# Patient Record
Sex: Male | Born: 1975
Health system: Southern US, Community
[De-identification: ages and names within clinical notes are randomized; demographics above are authoritative.]

---

## 2002-04-22 ENCOUNTER — Inpatient Hospital Stay (HOSPITAL_COMMUNITY): Admission: AD | Admit: 2002-04-22 | Discharge: 2002-05-03 | Payer: Self-pay | Admitting: Internal Medicine

## 2002-04-22 ENCOUNTER — Encounter: Payer: Self-pay | Admitting: Internal Medicine

## 2002-04-25 ENCOUNTER — Encounter: Payer: Self-pay | Admitting: Internal Medicine

## 2002-04-27 ENCOUNTER — Encounter: Payer: Self-pay | Admitting: Internal Medicine

## 2002-04-29 ENCOUNTER — Encounter: Payer: Self-pay | Admitting: Internal Medicine

## 2002-05-01 ENCOUNTER — Encounter: Payer: Self-pay | Admitting: Internal Medicine

## 2002-05-02 ENCOUNTER — Encounter: Payer: Self-pay | Admitting: Internal Medicine

## 2013-04-03 ENCOUNTER — Emergency Department (HOSPITAL_COMMUNITY): Payer: BC Managed Care – PPO

## 2013-04-03 ENCOUNTER — Encounter (HOSPITAL_COMMUNITY)
Admission: EM | Disposition: A | Discharge status: Other Acute Inpatient Hospital | Payer: Self-pay | Source: Home / Self Care | Attending: Emergency Medicine

## 2013-04-03 ENCOUNTER — Emergency Department (HOSPITAL_COMMUNITY)
Admission: EM | Admit: 2013-04-03 | Discharge: 2013-04-03 | Disposition: A | Discharge status: Other Acute Inpatient Hospital | Payer: BC Managed Care – PPO | Attending: Emergency Medicine | Admitting: Emergency Medicine

## 2013-04-03 DIAGNOSIS — S0990XA Unspecified injury of head, initial encounter: Secondary | ICD-10-CM | POA: Diagnosis not present

## 2013-04-03 DIAGNOSIS — Y9301 Activity, walking, marching and hiking: Secondary | ICD-10-CM | POA: Insufficient documentation

## 2013-04-03 DIAGNOSIS — W1809XA Striking against other object with subsequent fall, initial encounter: Secondary | ICD-10-CM | POA: Insufficient documentation

## 2013-04-03 DIAGNOSIS — S0531XA Ocular laceration without prolapse or loss of intraocular tissue, right eye, initial encounter: Secondary | ICD-10-CM

## 2013-04-03 DIAGNOSIS — S0590XA Unspecified injury of unspecified eye and orbit, initial encounter: Secondary | ICD-10-CM | POA: Diagnosis present

## 2013-04-03 DIAGNOSIS — F10229 Alcohol dependence with intoxication, unspecified: Secondary | ICD-10-CM | POA: Diagnosis not present

## 2013-04-03 DIAGNOSIS — S0520XA Ocular laceration and rupture with prolapse or loss of intraocular tissue, unspecified eye, initial encounter: Secondary | ICD-10-CM | POA: Insufficient documentation

## 2013-04-03 DIAGNOSIS — Y9229 Other specified public building as the place of occurrence of the external cause: Secondary | ICD-10-CM | POA: Insufficient documentation

## 2013-04-03 LAB — CBC WITH DIFFERENTIAL/PLATELET
Basophils Absolute: 0.1 10*3/uL (ref 0.0–0.1)
HCT: 41 % (ref 39.0–52.0)
Hemoglobin: 14.5 g/dL (ref 13.0–17.0)
Lymphocytes Relative: 33 % (ref 12–46)
Lymphs Abs: 2.7 10*3/uL (ref 0.7–4.0)
Monocytes Absolute: 0.7 10*3/uL (ref 0.1–1.0)
Neutro Abs: 4.9 10*3/uL (ref 1.7–7.7)
RBC: 4.85 MIL/uL (ref 4.22–5.81)
RDW: 13.1 % (ref 11.5–15.5)
WBC: 8.4 10*3/uL (ref 4.0–10.5)

## 2013-04-03 LAB — BASIC METABOLIC PANEL
BUN: 13 mg/dL (ref 6–23)
CO2: 24 mEq/L (ref 19–32)
Chloride: 100 mEq/L (ref 96–112)
GFR calc Af Amer: >90 mL/min (ref >90)
Potassium: 3.7 mEq/L (ref 3.5–5.1)

## 2013-04-03 SURGERY — CANCELLED PROCEDURE

## 2013-04-03 MED ORDER — DEXTROSE 5 % IV SOLN
1.0000 g | Freq: Once | INTRAVENOUS | Status: AC
  Administered 2013-04-03: 1 g via INTRAVENOUS
  Filled 2013-04-03: qty 1

## 2013-04-03 MED ORDER — LACTATED RINGERS IV SOLN
INTRAVENOUS | Status: DC
  Administered 2013-04-03: 02:00:00 via INTRAVENOUS

## 2013-04-03 MED ORDER — VANCOMYCIN HCL IN DEXTROSE 1-5 GM/200ML-% IV SOLN
1000.0000 mg | Freq: Once | INTRAVENOUS | Status: DC
  Filled 2013-04-03: qty 200

## 2013-04-03 MED ORDER — CEFTAZIDIME 1 G IJ SOLR: 1.0000 g | Freq: Once | INTRAMUSCULAR | Status: DC

## 2013-04-03 SURGICAL SUPPLY — 30 items
APL SRG 3 HI ABS STRL LF PLS (MISCELLANEOUS) ×1
APPLICATOR DR MATTHEWS STRL (MISCELLANEOUS) ×3 IMPLANT
CANNULA ANT CHAM MAIN (OPHTHALMIC RELATED) IMPLANT
CLOTH BEACON ORANGE TIMEOUT ST (SAFETY) ×3 IMPLANT
CORDS BIPOLAR (ELECTRODE) ×3 IMPLANT
ERASER HMR WETFIELD 23G BP (MISCELLANEOUS) ×3 IMPLANT
GLOVE SURG SIGNA 7.5 PF LTX (GLOVE) ×3 IMPLANT
GOWN STRL NON-REIN LRG LVL3 (GOWN DISPOSABLE) ×6 IMPLANT
KIT BASIN OR (CUSTOM PROCEDURE TRAY) ×3 IMPLANT
KIT ROOM TURNOVER OR (KITS) ×3 IMPLANT
MARKER SKIN DUAL TIP RULER LAB (MISCELLANEOUS) ×3 IMPLANT
NS IRRIG 1000ML POUR BTL (IV SOLUTION) ×3 IMPLANT
PACK CATARACT CUSTOM (CUSTOM PROCEDURE TRAY) ×3 IMPLANT
PAD ARMBOARD 7.5X6 YLW CONV (MISCELLANEOUS) ×6 IMPLANT
PAK VITRECTOMY PIK  23GA (OPHTHALMIC RELATED) IMPLANT
SPEAR EYE SURG WECK-CEL (MISCELLANEOUS) IMPLANT
STRIP CLOSURE SKIN 1/2X4 (GAUZE/BANDAGES/DRESSINGS) ×3 IMPLANT
SUT ETHILON 10 0 CS140 6 (SUTURE) ×3 IMPLANT
SUT ETHILON 8 0 TG100 8 (SUTURE) IMPLANT
SUT ETHILON 9 0 TG140 8 (SUTURE) IMPLANT
SUT MERSILENE 4 0 S 24 (SUTURE) IMPLANT
SUT SILK 4 0 RB 1 (SUTURE) IMPLANT
SUT VICRYL 7 0 TG140 8 (SUTURE) IMPLANT
SUT VICRYL 8 0 TG140 8 (SUTURE) IMPLANT
SUT VICRYL 9-0 (SUTURE) IMPLANT
SYR TB 1ML LUER SLIP (SYRINGE) ×3 IMPLANT
TOWEL OR 17X24 6PK STRL BLUE (TOWEL DISPOSABLE) ×6 IMPLANT
VITCUTTER PED ULTRA 25G SHORT (OPHTHALMIC) IMPLANT
WATER STERILE IRR 1000ML POUR (IV SOLUTION) ×3 IMPLANT
WIPE INSTRUMENT VISIWIPE 73X73 (MISCELLANEOUS) IMPLANT

## 2013-04-03 NOTE — ED Notes (Signed)
Trauma End: Pt transferred to [facility].
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
## 2013-04-03 NOTE — ED Notes (Signed)
Spoke with his wife Tresa Endo and she said he had a corneal transplant in his right eye 12 years ago.  She is out of town so will try to get in contact with his brother or father.
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
## 2013-04-15 ENCOUNTER — Ambulatory Visit
Admission: RE | Admit: 2013-04-15 | Discharge: 2013-04-15 | Disposition: A | Discharge status: Home / Self Care | Payer: BC Managed Care – PPO | Source: Ambulatory Visit | Attending: Internal Medicine | Admitting: Internal Medicine

## 2013-04-15 ENCOUNTER — Other Ambulatory Visit: Payer: Self-pay | Admitting: Internal Medicine

## 2013-04-15 DIAGNOSIS — S0591XA Unspecified injury of right eye and orbit, initial encounter: Secondary | ICD-10-CM

## 2014-09-21 ENCOUNTER — Encounter (HOSPITAL_COMMUNITY): Payer: Self-pay | Admitting: Ophthalmology
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
## 2019-10-11 ENCOUNTER — Other Ambulatory Visit: Payer: Self-pay

## 2019-10-11 ENCOUNTER — Encounter (INDEPENDENT_AMBULATORY_CARE_PROVIDER_SITE_OTHER): Payer: Self-pay | Admitting: Otolaryngology

## 2019-10-11 ENCOUNTER — Ambulatory Visit (INDEPENDENT_AMBULATORY_CARE_PROVIDER_SITE_OTHER): Payer: No Typology Code available for payment source | Admitting: Otolaryngology

## 2019-10-11 VITALS — Temp 97.2°F

## 2019-10-11 DIAGNOSIS — H9312 Tinnitus, left ear: Secondary | ICD-10-CM | POA: Diagnosis not present

## 2019-10-11 DIAGNOSIS — H9042 Sensorineural hearing loss, unilateral, left ear, with unrestricted hearing on the contralateral side: Secondary | ICD-10-CM | POA: Diagnosis not present

## 2019-10-11 MED ORDER — PREDNISONE 10 MG PO TABS: 10.0000 mg | ORAL_TABLET | Freq: Every day | ORAL | 0 refills | Status: AC

## 2019-10-11 NOTE — Progress Notes (Signed)
HPI: Dustin Waters is a 44 y.o. male who presents for evaluation of left ear tinnitus and hearing loss.  He states that his right ear has always been his better hearing ear.  But 1 week ago he woke up with ringing and diminished hearing in the left ear.  Denies any loud noise exposure or trauma to the ear.  Denies any upper respiratory infection.  He had a previous hearing test over 10 years ago but does not know the results but felt like he had slightly better hearing in the right ear at that time but nothing else was done.  However the tinnitus has been fairly constant over the past week.  No past medical history on file.  Social History   Socioeconomic History  . Marital status: Married    Spouse name: Not on file  . Number of children: Not on file  . Years of education: Not on file  . Highest education level: Not on file  Occupational History  . Not on file  Tobacco Use  . Smoking status: Never Smoker  . Smokeless tobacco: Never Used  Substance and Sexual Activity  . Alcohol use: Not on file  . Drug use: Not on file  . Sexual activity: Not on file  Other Topics Concern  . Not on file  Social History Narrative  . Not on file   Social Determinants of Health   Financial Resource Strain:   . Difficulty of Paying Living Expenses: Not on file  Food Insecurity:   . Worried About Programme researcher, broadcasting/film/video in the Last Year: Not on file  . Ran Out of Food in the Last Year: Not on file  Transportation Needs:   . Lack of Transportation (Medical): Not on file  . Lack of Transportation (Non-Medical): Not on file  Physical Activity:   . Days of Exercise per Week: Not on file  . Minutes of Exercise per Session: Not on file  Stress:   . Feeling of Stress : Not on file  Social Connections:   . Frequency of Communication with Friends and Family: Not on file  . Frequency of Social Gatherings with Friends and Family: Not on file  . Attends Religious Services: Not on file  . Active Member  of Clubs or Organizations: Not on file  . Attends Banker Meetings: Not on file  . Marital Status: Not on file   No family history on file. Not on File Prior to Admission medications   Not on File     Positive ROS: No dizziness or vertigo  All other systems have been reviewed and were otherwise negative with the exception of those mentioned in the HPI and as above.  Physical Exam: Constitutional: Alert, well-appearing, no acute distress Ears: External ears without lesions or tenderness. Ear canals are clear bilaterally with intact, clear TMs.  Bilaterally with good mobility on pneumatic otoscopy.  On hearing screening with a 512 1024 tuning fork he heard better in the right ear compared to the left but only a mild to moderate hearing loss noted in the left side.  AC > BC bilaterally. Nasal: External nose without lesions. Septum severely deviated to the left.  Mild rhinitis. Clear nasal passages otherwise. Oral: Lips and gums without lesions. Tongue and palate mucosa without lesions. Posterior oropharynx clear. Neck: No palpable adenopathy or masses Respiratory: Breathing comfortably  Skin: No facial/neck lesions or rash noted.  Procedures  Assessment: Sudden left ear SNHL with secondary tinnitus  Plan:  Unfortunately we are unable to obtain an audiogram today. I will treat him with prednisone taper over the next 2 weeks starting with 60 mg x 3 days. We will schedule follow-up in 2 weeks with audiologic testing.  Radene Journey, MD

## 2019-10-26 ENCOUNTER — Other Ambulatory Visit: Payer: Self-pay

## 2019-10-26 ENCOUNTER — Ambulatory Visit (INDEPENDENT_AMBULATORY_CARE_PROVIDER_SITE_OTHER): Payer: No Typology Code available for payment source | Admitting: Otolaryngology

## 2019-10-26 VITALS — Temp 97.9°F

## 2019-10-26 DIAGNOSIS — H9120 Sudden idiopathic hearing loss, unspecified ear: Secondary | ICD-10-CM

## 2019-10-26 NOTE — Progress Notes (Signed)
HPI: Dustin Waters is a 44 y.o. male who returns today for evaluation of left ear sudden sensorineural hearing loss.  He has completed the 2-week prednisone taper.  He feels like overall he is doing better but still has some ringing in the left ear but it is much better..  No past medical history on file.  Social History   Socioeconomic History  . Marital status: Married    Spouse name: Not on file  . Number of children: Not on file  . Years of education: Not on file  . Highest education level: Not on file  Occupational History  . Not on file  Tobacco Use  . Smoking status: Never Smoker  . Smokeless tobacco: Never Used  Substance and Sexual Activity  . Alcohol use: Not on file  . Drug use: Not on file  . Sexual activity: Not on file  Other Topics Concern  . Not on file  Social History Narrative  . Not on file   Social Determinants of Health   Financial Resource Strain:   . Difficulty of Paying Living Expenses: Not on file  Food Insecurity:   . Worried About Programme researcher, broadcasting/film/video in the Last Year: Not on file  . Ran Out of Food in the Last Year: Not on file  Transportation Needs:   . Lack of Transportation (Medical): Not on file  . Lack of Transportation (Non-Medical): Not on file  Physical Activity:   . Days of Exercise per Week: Not on file  . Minutes of Exercise per Session: Not on file  Stress:   . Feeling of Stress : Not on file  Social Connections:   . Frequency of Communication with Friends and Family: Not on file  . Frequency of Social Gatherings with Friends and Family: Not on file  . Attends Religious Services: Not on file  . Active Member of Clubs or Organizations: Not on file  . Attends Banker Meetings: Not on file  . Marital Status: Not on file   No family history on file. Not on File Prior to Admission medications   Not on File     Positive ROS: Otherwise negative  All other systems have been reviewed and were otherwise negative  with the exception of those mentioned in the HPI and as above.  Physical Exam: Constitutional: Alert, well-appearing, no acute distress Ears: External ears without lesions or tenderness. Ear canals are clear bilaterally with intact, clear TMs bilaterally. Nasal: External nose without lesions. Clear nasal passages Oral: Lips and gums without lesions. Tongue and palate mucosa without lesions. Posterior oropharynx clear. Neck: No palpable adenopathy or masses Respiratory: Breathing comfortably  Skin: No facial/neck lesions or rash noted.  Audiogram today demonstrated normal hearing in the right ear with SRT of 0 dB.  Hearing in the left ear shows some mild sensorineural hearing loss in the lower frequency and higher frequency but good hearing in the mid frequency with SRT of 10 dB.  He had type A tympanograms bilaterally  Procedures  Assessment: Left ear sudden sensorineural hearing loss  Plan: He has minimal hearing loss on audiogram today.  Concerning the tinnitus he could try the lipo flavonoid and reviewed with him concerning using masking noise.  Caution about using ear protection when around any loud noise.  He will follow-up if he notices any change or worsening in his hearing or tinnitus.   Narda Bonds, MD

## 2019-10-28 ENCOUNTER — Encounter (INDEPENDENT_AMBULATORY_CARE_PROVIDER_SITE_OTHER): Payer: Self-pay | Admitting: Otolaryngology

## 2020-03-21 DIAGNOSIS — M7660 Achilles tendinitis, unspecified leg: Secondary | ICD-10-CM | POA: Diagnosis not present

## 2020-03-21 DIAGNOSIS — Z125 Encounter for screening for malignant neoplasm of prostate: Secondary | ICD-10-CM | POA: Diagnosis not present

## 2020-03-21 DIAGNOSIS — M79674 Pain in right toe(s): Secondary | ICD-10-CM | POA: Diagnosis not present

## 2020-03-21 DIAGNOSIS — M7701 Medial epicondylitis, right elbow: Secondary | ICD-10-CM | POA: Diagnosis not present

## 2020-03-29 DIAGNOSIS — H18602 Keratoconus, unspecified, left eye: Secondary | ICD-10-CM | POA: Diagnosis not present

## 2020-04-25 DIAGNOSIS — M25522 Pain in left elbow: Secondary | ICD-10-CM | POA: Diagnosis not present

## 2020-04-25 DIAGNOSIS — M6281 Muscle weakness (generalized): Secondary | ICD-10-CM | POA: Diagnosis not present

## 2020-04-25 DIAGNOSIS — M25521 Pain in right elbow: Secondary | ICD-10-CM | POA: Diagnosis not present

## 2020-04-25 DIAGNOSIS — M25571 Pain in right ankle and joints of right foot: Secondary | ICD-10-CM | POA: Diagnosis not present

## 2020-04-30 ENCOUNTER — Other Ambulatory Visit: Payer: Self-pay

## 2020-04-30 ENCOUNTER — Ambulatory Visit (INDEPENDENT_AMBULATORY_CARE_PROVIDER_SITE_OTHER): Payer: BC Managed Care – PPO

## 2020-04-30 ENCOUNTER — Ambulatory Visit (INDEPENDENT_AMBULATORY_CARE_PROVIDER_SITE_OTHER): Payer: BC Managed Care – PPO | Admitting: Podiatrist

## 2020-04-30 ENCOUNTER — Encounter: Payer: Self-pay | Admitting: Podiatrist

## 2020-04-30 DIAGNOSIS — M79671 Pain in right foot: Secondary | ICD-10-CM | POA: Diagnosis not present

## 2020-04-30 DIAGNOSIS — M9261 Juvenile osteochondrosis of tarsus, right ankle: Secondary | ICD-10-CM

## 2020-04-30 DIAGNOSIS — D2262 Melanocytic nevi of left upper limb, including shoulder: Secondary | ICD-10-CM | POA: Diagnosis not present

## 2020-04-30 DIAGNOSIS — L400 Psoriasis vulgaris: Secondary | ICD-10-CM | POA: Diagnosis not present

## 2020-04-30 DIAGNOSIS — M19071 Primary osteoarthritis, right ankle and foot: Secondary | ICD-10-CM | POA: Diagnosis not present

## 2020-04-30 DIAGNOSIS — Z85828 Personal history of other malignant neoplasm of skin: Secondary | ICD-10-CM | POA: Diagnosis not present

## 2020-04-30 DIAGNOSIS — Q667 Congenital pes cavus, unspecified foot: Secondary | ICD-10-CM | POA: Diagnosis not present

## 2020-04-30 DIAGNOSIS — D2261 Melanocytic nevi of right upper limb, including shoulder: Secondary | ICD-10-CM | POA: Diagnosis not present

## 2020-05-01 DIAGNOSIS — M25522 Pain in left elbow: Secondary | ICD-10-CM | POA: Diagnosis not present

## 2020-05-01 DIAGNOSIS — M25521 Pain in right elbow: Secondary | ICD-10-CM | POA: Diagnosis not present

## 2020-05-01 DIAGNOSIS — R262 Difficulty in walking, not elsewhere classified: Secondary | ICD-10-CM | POA: Diagnosis not present

## 2020-05-01 DIAGNOSIS — M25571 Pain in right ankle and joints of right foot: Secondary | ICD-10-CM | POA: Diagnosis not present

## 2020-05-03 NOTE — Progress Notes (Signed)
Chief Complaint  Patient presents with  . Foot Problem    R dorsal/plantar forefoot submet 1. x5 yrs. Pt stated, "I have pain in this joint when I walk long distances, especially if I'm wearing flip-flops. Pain = 7/10 most of the time. It seems to be swollen".  . Foot Pain    R foot, back of heel. x1 yr. Pt stated, "I have a lot of stiffness and pain, especially after walking long distances, running, and cycling. It feels worse when I rest after exercise and when I wake up in the morning. Pain = 9/10. I had to stop running".     HPI: Patient is 44 y.o. male who presents today for pain in the right foot at the great toe joint and the posterior heel. He relates pain with walking especially after [place] he will have pain the next day when standing.   There are no problems to display for this patient.   No current outpatient medications on file prior to visit.   No current facility-administered medications on file prior to visit.    Not on File  Review of Systems No fevers, chills, nausea, muscle aches, no difficulty breathing, no calf pain, no chest pain or shortness of breath.   Physical Exam  GENERAL APPEARANCE: Alert, conversant. Appropriately groomed. No acute distress.   VASCULAR: Pedal pulses palpable DP and PT bilateral.  Capillary refill time is immediate to all digits,  Proximal to distal cooling it warm to warm.  Digital perfusion adequate.   NEUROLOGIC: sensation is intact to 5.07 monofilament at 5/5 sites bilateral.  Light touch is intact bilateral, vibratory sensation intact bilateral  MUSCULOSKELETAL: acceptable muscle strength, tone and stability bilateral.  No gross boney pedal deformities noted. Decrease range of motion of first metatarsal phalangeal joint noted right.  Pain on palpation posterior calcaneus at insertion of the achilles tendon noted. High arched foot type noted.   DERMATOLOGIC: skin is warm, supple, and dry.   No open lesions noted.  No rash, no pre ulcerative lesions. Digital nails are asymptomatic.    Xray:  3 views of the right foot are obtained.,  Decreased joint space at the first metatarsal phalangeal joint is noted. Spurring on the dorsal aspect of the first metatarsal head is also noted.  Inflammation at the posterior calcaneus is noted as well.   Assessment     ICD-10-CM   1. Right foot pain  M79.671 DG Foot Complete Right  2. Arthritis of first metatarsophalangeal (MTP) joint of right foot  M19.071   3. Acquired Haglund's deformity of right heel  M92.61   4. High foot arch  Q66.70      Plan  Discussed treatment options.  Discussed the positive benefits of orthotics and he will be set up to see Raiford Noble for an orthotic casting.  Also discussed a cortisone injection into the right first mpj to decrease inflammation in the joint.  Procedure: Injection first metatarsal phalangeal joint right foot Discussed alternatives, risks, complications and verbal consent was obtained.  Skin Prep: Alcohol. Injectate: 1cc 0.5% marcaine plain, 0.5 cc kenalog 10 injected into the joint dorsally.  Disposition: Patient tolerated procedure well. Injection site dressed with a band-aid.  Post-injection care was discussed and return precautions discussed.   He will call if any concerns arise- discussed he could benefit from a surgery to remove the bone spurs and restore motion to the first mp joint if the injection is not beneficial.

## 2020-05-04 DIAGNOSIS — M25522 Pain in left elbow: Secondary | ICD-10-CM | POA: Diagnosis not present

## 2020-05-04 DIAGNOSIS — R262 Difficulty in walking, not elsewhere classified: Secondary | ICD-10-CM | POA: Diagnosis not present

## 2020-05-04 DIAGNOSIS — M25521 Pain in right elbow: Secondary | ICD-10-CM | POA: Diagnosis not present

## 2020-05-04 DIAGNOSIS — M25571 Pain in right ankle and joints of right foot: Secondary | ICD-10-CM | POA: Diagnosis not present

## 2020-05-08 DIAGNOSIS — M25571 Pain in right ankle and joints of right foot: Secondary | ICD-10-CM | POA: Diagnosis not present

## 2020-05-08 DIAGNOSIS — M25522 Pain in left elbow: Secondary | ICD-10-CM | POA: Diagnosis not present

## 2020-05-08 DIAGNOSIS — M25521 Pain in right elbow: Secondary | ICD-10-CM | POA: Diagnosis not present

## 2020-05-08 DIAGNOSIS — R262 Difficulty in walking, not elsewhere classified: Secondary | ICD-10-CM | POA: Diagnosis not present

## 2020-05-10 DIAGNOSIS — M25521 Pain in right elbow: Secondary | ICD-10-CM | POA: Diagnosis not present

## 2020-05-10 DIAGNOSIS — M25522 Pain in left elbow: Secondary | ICD-10-CM | POA: Diagnosis not present

## 2020-05-10 DIAGNOSIS — M25571 Pain in right ankle and joints of right foot: Secondary | ICD-10-CM | POA: Diagnosis not present

## 2020-05-10 DIAGNOSIS — R262 Difficulty in walking, not elsewhere classified: Secondary | ICD-10-CM | POA: Diagnosis not present

## 2020-05-16 DIAGNOSIS — M25571 Pain in right ankle and joints of right foot: Secondary | ICD-10-CM | POA: Diagnosis not present

## 2020-05-16 DIAGNOSIS — M25522 Pain in left elbow: Secondary | ICD-10-CM | POA: Diagnosis not present

## 2020-05-16 DIAGNOSIS — M25521 Pain in right elbow: Secondary | ICD-10-CM | POA: Diagnosis not present

## 2020-05-16 DIAGNOSIS — R262 Difficulty in walking, not elsewhere classified: Secondary | ICD-10-CM | POA: Diagnosis not present

## 2020-05-17 ENCOUNTER — Other Ambulatory Visit: Payer: Self-pay

## 2020-05-17 ENCOUNTER — Ambulatory Visit (INDEPENDENT_AMBULATORY_CARE_PROVIDER_SITE_OTHER): Payer: BC Managed Care – PPO | Admitting: Orthotics

## 2020-05-17 DIAGNOSIS — Q6672 Congenital pes cavus, left foot: Secondary | ICD-10-CM | POA: Diagnosis not present

## 2020-05-17 DIAGNOSIS — M9261 Juvenile osteochondrosis of tarsus, right ankle: Secondary | ICD-10-CM | POA: Diagnosis not present

## 2020-05-17 DIAGNOSIS — Q6671 Congenital pes cavus, right foot: Secondary | ICD-10-CM | POA: Diagnosis not present

## 2020-05-17 DIAGNOSIS — M19071 Primary osteoarthritis, right ankle and foot: Secondary | ICD-10-CM | POA: Diagnosis not present

## 2020-05-17 DIAGNOSIS — M79671 Pain in right foot: Secondary | ICD-10-CM

## 2020-05-17 NOTE — Progress Notes (Signed)
Cast todaY FOR CMFO to address several conditios R > L:  Posterior heel pain (Haglunds) and Hallux Rigidus.  Plan on CMFO w/ morton extension R as well as 1/8" lift b/l.

## 2020-05-18 DIAGNOSIS — M25521 Pain in right elbow: Secondary | ICD-10-CM | POA: Diagnosis not present

## 2020-05-18 DIAGNOSIS — M25522 Pain in left elbow: Secondary | ICD-10-CM | POA: Diagnosis not present

## 2020-05-18 DIAGNOSIS — R262 Difficulty in walking, not elsewhere classified: Secondary | ICD-10-CM | POA: Diagnosis not present

## 2020-05-18 DIAGNOSIS — M25571 Pain in right ankle and joints of right foot: Secondary | ICD-10-CM | POA: Diagnosis not present

## 2020-05-22 DIAGNOSIS — M25521 Pain in right elbow: Secondary | ICD-10-CM | POA: Diagnosis not present

## 2020-05-22 DIAGNOSIS — R262 Difficulty in walking, not elsewhere classified: Secondary | ICD-10-CM | POA: Diagnosis not present

## 2020-05-22 DIAGNOSIS — M25571 Pain in right ankle and joints of right foot: Secondary | ICD-10-CM | POA: Diagnosis not present

## 2020-05-22 DIAGNOSIS — M25522 Pain in left elbow: Secondary | ICD-10-CM | POA: Diagnosis not present

## 2020-05-24 DIAGNOSIS — M25571 Pain in right ankle and joints of right foot: Secondary | ICD-10-CM | POA: Diagnosis not present

## 2020-05-24 DIAGNOSIS — R262 Difficulty in walking, not elsewhere classified: Secondary | ICD-10-CM | POA: Diagnosis not present

## 2020-05-24 DIAGNOSIS — M25522 Pain in left elbow: Secondary | ICD-10-CM | POA: Diagnosis not present

## 2020-05-24 DIAGNOSIS — M25521 Pain in right elbow: Secondary | ICD-10-CM | POA: Diagnosis not present

## 2020-06-05 DIAGNOSIS — R262 Difficulty in walking, not elsewhere classified: Secondary | ICD-10-CM | POA: Diagnosis not present

## 2020-06-05 DIAGNOSIS — M25522 Pain in left elbow: Secondary | ICD-10-CM | POA: Diagnosis not present

## 2020-06-05 DIAGNOSIS — M25521 Pain in right elbow: Secondary | ICD-10-CM | POA: Diagnosis not present

## 2020-06-05 DIAGNOSIS — M25571 Pain in right ankle and joints of right foot: Secondary | ICD-10-CM | POA: Diagnosis not present

## 2020-06-07 ENCOUNTER — Ambulatory Visit: Payer: BC Managed Care – PPO | Admitting: Orthotics

## 2020-06-07 ENCOUNTER — Other Ambulatory Visit: Payer: Self-pay

## 2020-06-07 DIAGNOSIS — M25571 Pain in right ankle and joints of right foot: Secondary | ICD-10-CM | POA: Diagnosis not present

## 2020-06-07 DIAGNOSIS — M19071 Primary osteoarthritis, right ankle and foot: Secondary | ICD-10-CM

## 2020-06-07 DIAGNOSIS — M25521 Pain in right elbow: Secondary | ICD-10-CM | POA: Diagnosis not present

## 2020-06-07 DIAGNOSIS — R262 Difficulty in walking, not elsewhere classified: Secondary | ICD-10-CM | POA: Diagnosis not present

## 2020-06-07 DIAGNOSIS — M79671 Pain in right foot: Secondary | ICD-10-CM

## 2020-06-07 DIAGNOSIS — M25522 Pain in left elbow: Secondary | ICD-10-CM | POA: Diagnosis not present

## 2020-06-07 NOTE — Progress Notes (Signed)
Patient picked up f/o; no issues.

## 2020-06-21 DIAGNOSIS — M25571 Pain in right ankle and joints of right foot: Secondary | ICD-10-CM | POA: Diagnosis not present

## 2020-06-21 DIAGNOSIS — M25521 Pain in right elbow: Secondary | ICD-10-CM | POA: Diagnosis not present

## 2020-06-21 DIAGNOSIS — M25522 Pain in left elbow: Secondary | ICD-10-CM | POA: Diagnosis not present

## 2020-06-21 DIAGNOSIS — R262 Difficulty in walking, not elsewhere classified: Secondary | ICD-10-CM | POA: Diagnosis not present

## 2020-07-03 DIAGNOSIS — Z832 Family history of diseases of the blood and blood-forming organs and certain disorders involving the immune mechanism: Secondary | ICD-10-CM | POA: Diagnosis not present

## 2020-07-03 DIAGNOSIS — M25521 Pain in right elbow: Secondary | ICD-10-CM | POA: Diagnosis not present

## 2020-07-03 DIAGNOSIS — Z23 Encounter for immunization: Secondary | ICD-10-CM | POA: Diagnosis not present

## 2020-07-03 DIAGNOSIS — M7701 Medial epicondylitis, right elbow: Secondary | ICD-10-CM | POA: Diagnosis not present

## 2020-07-03 DIAGNOSIS — Z77018 Contact with and (suspected) exposure to other hazardous metals: Secondary | ICD-10-CM | POA: Diagnosis not present

## 2020-07-03 DIAGNOSIS — M25571 Pain in right ankle and joints of right foot: Secondary | ICD-10-CM | POA: Diagnosis not present

## 2020-07-03 DIAGNOSIS — M25522 Pain in left elbow: Secondary | ICD-10-CM | POA: Diagnosis not present

## 2020-07-03 DIAGNOSIS — M7712 Lateral epicondylitis, left elbow: Secondary | ICD-10-CM | POA: Diagnosis not present

## 2020-07-03 DIAGNOSIS — R262 Difficulty in walking, not elsewhere classified: Secondary | ICD-10-CM | POA: Diagnosis not present

## 2020-07-03 DIAGNOSIS — Z13220 Encounter for screening for lipoid disorders: Secondary | ICD-10-CM | POA: Diagnosis not present

## 2020-07-03 DIAGNOSIS — Z0000 Encounter for general adult medical examination without abnormal findings: Secondary | ICD-10-CM | POA: Diagnosis not present

## 2020-07-19 DIAGNOSIS — R262 Difficulty in walking, not elsewhere classified: Secondary | ICD-10-CM | POA: Diagnosis not present

## 2020-07-19 DIAGNOSIS — M25522 Pain in left elbow: Secondary | ICD-10-CM | POA: Diagnosis not present

## 2020-07-19 DIAGNOSIS — M25571 Pain in right ankle and joints of right foot: Secondary | ICD-10-CM | POA: Diagnosis not present

## 2020-07-19 DIAGNOSIS — M25521 Pain in right elbow: Secondary | ICD-10-CM | POA: Diagnosis not present

## 2020-07-26 DIAGNOSIS — M25522 Pain in left elbow: Secondary | ICD-10-CM | POA: Diagnosis not present

## 2020-07-26 DIAGNOSIS — R262 Difficulty in walking, not elsewhere classified: Secondary | ICD-10-CM | POA: Diagnosis not present

## 2020-07-26 DIAGNOSIS — M25571 Pain in right ankle and joints of right foot: Secondary | ICD-10-CM | POA: Diagnosis not present

## 2020-07-26 DIAGNOSIS — M25521 Pain in right elbow: Secondary | ICD-10-CM | POA: Diagnosis not present

## 2020-07-30 DIAGNOSIS — R262 Difficulty in walking, not elsewhere classified: Secondary | ICD-10-CM | POA: Diagnosis not present

## 2020-07-30 DIAGNOSIS — M25522 Pain in left elbow: Secondary | ICD-10-CM | POA: Diagnosis not present

## 2020-07-30 DIAGNOSIS — M25571 Pain in right ankle and joints of right foot: Secondary | ICD-10-CM | POA: Diagnosis not present

## 2020-07-30 DIAGNOSIS — M25521 Pain in right elbow: Secondary | ICD-10-CM | POA: Diagnosis not present

## 2020-08-07 DIAGNOSIS — M25521 Pain in right elbow: Secondary | ICD-10-CM | POA: Diagnosis not present

## 2020-08-07 DIAGNOSIS — M25522 Pain in left elbow: Secondary | ICD-10-CM | POA: Diagnosis not present

## 2020-08-07 DIAGNOSIS — R262 Difficulty in walking, not elsewhere classified: Secondary | ICD-10-CM | POA: Diagnosis not present

## 2020-08-07 DIAGNOSIS — M25571 Pain in right ankle and joints of right foot: Secondary | ICD-10-CM | POA: Diagnosis not present

## 2020-08-14 DIAGNOSIS — M25571 Pain in right ankle and joints of right foot: Secondary | ICD-10-CM | POA: Diagnosis not present

## 2020-08-14 DIAGNOSIS — M25521 Pain in right elbow: Secondary | ICD-10-CM | POA: Diagnosis not present

## 2020-08-14 DIAGNOSIS — R262 Difficulty in walking, not elsewhere classified: Secondary | ICD-10-CM | POA: Diagnosis not present

## 2020-08-14 DIAGNOSIS — M25522 Pain in left elbow: Secondary | ICD-10-CM | POA: Diagnosis not present

## 2020-08-27 DIAGNOSIS — M25571 Pain in right ankle and joints of right foot: Secondary | ICD-10-CM | POA: Diagnosis not present

## 2020-08-27 DIAGNOSIS — M25521 Pain in right elbow: Secondary | ICD-10-CM | POA: Diagnosis not present

## 2020-08-27 DIAGNOSIS — M25522 Pain in left elbow: Secondary | ICD-10-CM | POA: Diagnosis not present

## 2020-08-27 DIAGNOSIS — R262 Difficulty in walking, not elsewhere classified: Secondary | ICD-10-CM | POA: Diagnosis not present

## 2020-08-28 DIAGNOSIS — M25522 Pain in left elbow: Secondary | ICD-10-CM | POA: Diagnosis not present

## 2020-08-28 DIAGNOSIS — R262 Difficulty in walking, not elsewhere classified: Secondary | ICD-10-CM | POA: Diagnosis not present

## 2020-08-28 DIAGNOSIS — M25521 Pain in right elbow: Secondary | ICD-10-CM | POA: Diagnosis not present

## 2020-08-28 DIAGNOSIS — M25571 Pain in right ankle and joints of right foot: Secondary | ICD-10-CM | POA: Diagnosis not present

## 2020-09-04 DIAGNOSIS — M25522 Pain in left elbow: Secondary | ICD-10-CM | POA: Diagnosis not present

## 2020-09-04 DIAGNOSIS — R262 Difficulty in walking, not elsewhere classified: Secondary | ICD-10-CM | POA: Diagnosis not present

## 2020-09-04 DIAGNOSIS — M25571 Pain in right ankle and joints of right foot: Secondary | ICD-10-CM | POA: Diagnosis not present

## 2020-09-04 DIAGNOSIS — M25521 Pain in right elbow: Secondary | ICD-10-CM | POA: Diagnosis not present

## 2020-09-06 DIAGNOSIS — M25521 Pain in right elbow: Secondary | ICD-10-CM | POA: Diagnosis not present

## 2020-09-06 DIAGNOSIS — M25571 Pain in right ankle and joints of right foot: Secondary | ICD-10-CM | POA: Diagnosis not present

## 2020-09-06 DIAGNOSIS — M25522 Pain in left elbow: Secondary | ICD-10-CM | POA: Diagnosis not present

## 2020-09-06 DIAGNOSIS — R262 Difficulty in walking, not elsewhere classified: Secondary | ICD-10-CM | POA: Diagnosis not present

## 2020-09-10 ENCOUNTER — Ambulatory Visit: Payer: BC Managed Care – PPO | Admitting: Orthotics

## 2020-09-10 ENCOUNTER — Other Ambulatory Visit: Payer: Self-pay

## 2020-09-10 DIAGNOSIS — M19071 Primary osteoarthritis, right ankle and foot: Secondary | ICD-10-CM

## 2020-09-10 DIAGNOSIS — M79671 Pain in right foot: Secondary | ICD-10-CM

## 2020-09-10 NOTE — Progress Notes (Signed)
Sending back to richie to be redone;  Needs rigid mortons left and b/l small met pads

## 2020-09-13 DIAGNOSIS — R262 Difficulty in walking, not elsewhere classified: Secondary | ICD-10-CM | POA: Diagnosis not present

## 2020-09-13 DIAGNOSIS — M25521 Pain in right elbow: Secondary | ICD-10-CM | POA: Diagnosis not present

## 2020-09-13 DIAGNOSIS — M25522 Pain in left elbow: Secondary | ICD-10-CM | POA: Diagnosis not present

## 2020-09-13 DIAGNOSIS — M25571 Pain in right ankle and joints of right foot: Secondary | ICD-10-CM | POA: Diagnosis not present

## 2020-09-20 DIAGNOSIS — M25521 Pain in right elbow: Secondary | ICD-10-CM | POA: Diagnosis not present

## 2020-09-20 DIAGNOSIS — M25522 Pain in left elbow: Secondary | ICD-10-CM | POA: Diagnosis not present

## 2020-09-20 DIAGNOSIS — M25571 Pain in right ankle and joints of right foot: Secondary | ICD-10-CM | POA: Diagnosis not present

## 2020-09-20 DIAGNOSIS — R262 Difficulty in walking, not elsewhere classified: Secondary | ICD-10-CM | POA: Diagnosis not present

## 2020-09-26 DIAGNOSIS — M25521 Pain in right elbow: Secondary | ICD-10-CM | POA: Diagnosis not present

## 2020-09-26 DIAGNOSIS — M25571 Pain in right ankle and joints of right foot: Secondary | ICD-10-CM | POA: Diagnosis not present

## 2020-09-26 DIAGNOSIS — M25522 Pain in left elbow: Secondary | ICD-10-CM | POA: Diagnosis not present

## 2020-09-26 DIAGNOSIS — R262 Difficulty in walking, not elsewhere classified: Secondary | ICD-10-CM | POA: Diagnosis not present

## 2020-10-01 ENCOUNTER — Encounter: Payer: BC Managed Care – PPO | Admitting: Orthotics

## 2020-10-25 ENCOUNTER — Telehealth: Payer: Self-pay | Admitting: Podiatrist

## 2020-10-25 NOTE — Telephone Encounter (Signed)
Orthotics in, left message for pt to call to schedule an appt to pick them up.Marland KitchenMarland Kitchen

## 2021-07-04 DIAGNOSIS — Z0000 Encounter for general adult medical examination without abnormal findings: Secondary | ICD-10-CM | POA: Diagnosis not present

## 2021-07-04 DIAGNOSIS — Z832 Family history of diseases of the blood and blood-forming organs and certain disorders involving the immune mechanism: Secondary | ICD-10-CM | POA: Diagnosis not present

## 2021-07-04 DIAGNOSIS — Z1211 Encounter for screening for malignant neoplasm of colon: Secondary | ICD-10-CM | POA: Diagnosis not present

## 2021-07-04 DIAGNOSIS — Z13220 Encounter for screening for lipoid disorders: Secondary | ICD-10-CM | POA: Diagnosis not present

## 2021-07-26 DIAGNOSIS — H18602 Keratoconus, unspecified, left eye: Secondary | ICD-10-CM | POA: Diagnosis not present

## 2021-10-29 DIAGNOSIS — S0571XD Avulsion of right eye, subsequent encounter: Secondary | ICD-10-CM | POA: Diagnosis not present

## 2022-02-25 DIAGNOSIS — M542 Cervicalgia: Secondary | ICD-10-CM | POA: Diagnosis not present

## 2022-02-25 DIAGNOSIS — M25512 Pain in left shoulder: Secondary | ICD-10-CM | POA: Diagnosis not present

## 2022-03-03 DIAGNOSIS — Z1211 Encounter for screening for malignant neoplasm of colon: Secondary | ICD-10-CM | POA: Diagnosis not present

## 2022-03-03 DIAGNOSIS — K648 Other hemorrhoids: Secondary | ICD-10-CM | POA: Diagnosis not present

## 2022-03-03 DIAGNOSIS — D125 Benign neoplasm of sigmoid colon: Secondary | ICD-10-CM | POA: Diagnosis not present

## 2022-03-26 DIAGNOSIS — F4320 Adjustment disorder, unspecified: Secondary | ICD-10-CM | POA: Diagnosis not present

## 2022-04-28 DIAGNOSIS — M542 Cervicalgia: Secondary | ICD-10-CM | POA: Diagnosis not present

## 2022-04-28 DIAGNOSIS — R197 Diarrhea, unspecified: Secondary | ICD-10-CM | POA: Diagnosis not present

## 2022-04-29 DIAGNOSIS — R197 Diarrhea, unspecified: Secondary | ICD-10-CM | POA: Diagnosis not present

## 2022-05-15 DIAGNOSIS — M25512 Pain in left shoulder: Secondary | ICD-10-CM | POA: Diagnosis not present

## 2022-05-15 DIAGNOSIS — M542 Cervicalgia: Secondary | ICD-10-CM | POA: Diagnosis not present

## 2022-06-10 DIAGNOSIS — D2261 Melanocytic nevi of right upper limb, including shoulder: Secondary | ICD-10-CM | POA: Diagnosis not present

## 2022-06-10 DIAGNOSIS — D2239 Melanocytic nevi of other parts of face: Secondary | ICD-10-CM | POA: Diagnosis not present

## 2022-06-10 DIAGNOSIS — L570 Actinic keratosis: Secondary | ICD-10-CM | POA: Diagnosis not present

## 2022-06-10 DIAGNOSIS — Z85828 Personal history of other malignant neoplasm of skin: Secondary | ICD-10-CM | POA: Diagnosis not present

## 2022-06-10 DIAGNOSIS — L400 Psoriasis vulgaris: Secondary | ICD-10-CM | POA: Diagnosis not present

## 2022-07-29 DIAGNOSIS — Z8669 Personal history of other diseases of the nervous system and sense organs: Secondary | ICD-10-CM | POA: Diagnosis not present

## 2022-07-29 DIAGNOSIS — H18602 Keratoconus, unspecified, left eye: Secondary | ICD-10-CM | POA: Diagnosis not present

## 2022-08-04 DIAGNOSIS — Z0000 Encounter for general adult medical examination without abnormal findings: Secondary | ICD-10-CM | POA: Diagnosis not present

## 2022-10-29 DIAGNOSIS — S0571XD Avulsion of right eye, subsequent encounter: Secondary | ICD-10-CM | POA: Diagnosis not present

## 2022-11-05 DIAGNOSIS — M25512 Pain in left shoulder: Secondary | ICD-10-CM | POA: Diagnosis not present

## 2022-12-01 DIAGNOSIS — M25512 Pain in left shoulder: Secondary | ICD-10-CM | POA: Diagnosis not present

## 2023-08-03 DIAGNOSIS — H18602 Keratoconus, unspecified, left eye: Secondary | ICD-10-CM | POA: Diagnosis not present

## 2023-08-12 DIAGNOSIS — Z0000 Encounter for general adult medical examination without abnormal findings: Secondary | ICD-10-CM | POA: Diagnosis not present

## 2023-08-12 DIAGNOSIS — D6851 Activated protein C resistance: Secondary | ICD-10-CM | POA: Diagnosis not present

## 2023-08-12 DIAGNOSIS — G629 Polyneuropathy, unspecified: Secondary | ICD-10-CM | POA: Diagnosis not present

## 2023-08-12 DIAGNOSIS — M79674 Pain in right toe(s): Secondary | ICD-10-CM | POA: Diagnosis not present

## 2023-08-12 DIAGNOSIS — Z125 Encounter for screening for malignant neoplasm of prostate: Secondary | ICD-10-CM | POA: Diagnosis not present

## 2023-09-16 DIAGNOSIS — D72829 Elevated white blood cell count, unspecified: Secondary | ICD-10-CM | POA: Diagnosis not present

## 2023-09-21 DIAGNOSIS — Z85828 Personal history of other malignant neoplasm of skin: Secondary | ICD-10-CM | POA: Diagnosis not present

## 2023-09-21 DIAGNOSIS — D2262 Melanocytic nevi of left upper limb, including shoulder: Secondary | ICD-10-CM | POA: Diagnosis not present

## 2023-09-21 DIAGNOSIS — L918 Other hypertrophic disorders of the skin: Secondary | ICD-10-CM | POA: Diagnosis not present

## 2023-09-21 DIAGNOSIS — D2261 Melanocytic nevi of right upper limb, including shoulder: Secondary | ICD-10-CM | POA: Diagnosis not present

## 2023-10-21 DIAGNOSIS — F4320 Adjustment disorder, unspecified: Secondary | ICD-10-CM | POA: Diagnosis not present

## 2023-11-02 DIAGNOSIS — S0571XD Avulsion of right eye, subsequent encounter: Secondary | ICD-10-CM | POA: Diagnosis not present

## 2024-04-20 DIAGNOSIS — F4320 Adjustment disorder, unspecified: Secondary | ICD-10-CM | POA: Diagnosis not present

## 2024-08-08 DIAGNOSIS — H18602 Keratoconus, unspecified, left eye: Secondary | ICD-10-CM | POA: Diagnosis not present

## 2024-08-18 ENCOUNTER — Other Ambulatory Visit (HOSPITAL_BASED_OUTPATIENT_CLINIC_OR_DEPARTMENT_OTHER): Payer: Self-pay | Admitting: Internal Medicine

## 2024-08-18 DIAGNOSIS — Z125 Encounter for screening for malignant neoplasm of prostate: Secondary | ICD-10-CM | POA: Diagnosis not present

## 2024-08-18 DIAGNOSIS — E790 Hyperuricemia without signs of inflammatory arthritis and tophaceous disease: Secondary | ICD-10-CM | POA: Diagnosis not present

## 2024-08-18 DIAGNOSIS — D6851 Activated protein C resistance: Secondary | ICD-10-CM | POA: Diagnosis not present

## 2024-08-18 DIAGNOSIS — Z13220 Encounter for screening for lipoid disorders: Secondary | ICD-10-CM | POA: Diagnosis not present

## 2024-08-18 DIAGNOSIS — Z1211 Encounter for screening for malignant neoplasm of colon: Secondary | ICD-10-CM | POA: Diagnosis not present

## 2024-08-18 DIAGNOSIS — Z832 Family history of diseases of the blood and blood-forming organs and certain disorders involving the immune mechanism: Secondary | ICD-10-CM | POA: Diagnosis not present

## 2024-08-18 DIAGNOSIS — Z8249 Family history of ischemic heart disease and other diseases of the circulatory system: Secondary | ICD-10-CM

## 2024-08-18 DIAGNOSIS — Z0000 Encounter for general adult medical examination without abnormal findings: Secondary | ICD-10-CM | POA: Diagnosis not present

## 2024-08-18 DIAGNOSIS — G629 Polyneuropathy, unspecified: Secondary | ICD-10-CM | POA: Diagnosis not present

## 2024-08-30 ENCOUNTER — Ambulatory Visit (HOSPITAL_BASED_OUTPATIENT_CLINIC_OR_DEPARTMENT_OTHER)
Admission: RE | Admit: 2024-08-30 | Discharge: 2024-08-30 | Disposition: A | Discharge status: Home / Self Care | Payer: Self-pay | Source: Ambulatory Visit | Attending: Internal Medicine | Admitting: Internal Medicine

## 2024-08-30 DIAGNOSIS — Z8249 Family history of ischemic heart disease and other diseases of the circulatory system: Secondary | ICD-10-CM
